# Patient Record
Sex: Male | Born: 1980 | Race: Black or African American | Hispanic: No | Marital: Married
Health system: Southern US, Community
[De-identification: ages and names within clinical notes are randomized; demographics above are authoritative.]

## PROBLEM LIST (undated history)

## (undated) DIAGNOSIS — G43909 Migraine, unspecified, not intractable, without status migrainosus: Secondary | ICD-10-CM

---

## 2018-03-20 ENCOUNTER — Emergency Department (HOSPITAL_COMMUNITY)
Admission: EM | Admit: 2018-03-20 | Discharge: 2018-03-20 | Disposition: A | Discharge status: Home / Self Care | Payer: BLUE CROSS/BLUE SHIELD | Attending: Emergency Medicine | Admitting: Emergency Medicine

## 2018-03-20 ENCOUNTER — Emergency Department (HOSPITAL_COMMUNITY): Payer: BLUE CROSS/BLUE SHIELD

## 2018-03-20 ENCOUNTER — Encounter (HOSPITAL_COMMUNITY): Payer: Self-pay | Admitting: Emergency Medicine

## 2018-03-20 DIAGNOSIS — Y999 Unspecified external cause status: Secondary | ICD-10-CM | POA: Insufficient documentation

## 2018-03-20 DIAGNOSIS — W540XXA Bitten by dog, initial encounter: Secondary | ICD-10-CM | POA: Diagnosis not present

## 2018-03-20 DIAGNOSIS — S61213A Laceration without foreign body of left middle finger without damage to nail, initial encounter: Secondary | ICD-10-CM | POA: Insufficient documentation

## 2018-03-20 DIAGNOSIS — S61411A Laceration without foreign body of right hand, initial encounter: Secondary | ICD-10-CM | POA: Diagnosis not present

## 2018-03-20 DIAGNOSIS — Y9389 Activity, other specified: Secondary | ICD-10-CM | POA: Insufficient documentation

## 2018-03-20 DIAGNOSIS — Y929 Unspecified place or not applicable: Secondary | ICD-10-CM | POA: Insufficient documentation

## 2018-03-20 DIAGNOSIS — S61512A Laceration without foreign body of left wrist, initial encounter: Secondary | ICD-10-CM

## 2018-03-20 DIAGNOSIS — S61511A Laceration without foreign body of right wrist, initial encounter: Secondary | ICD-10-CM | POA: Diagnosis not present

## 2018-03-20 HISTORY — DX: Migraine, unspecified, not intractable, without status migrainosus: G43.909

## 2018-03-20 MED ORDER — OXYCODONE-ACETAMINOPHEN 5-325 MG PO TABS
1.0000 | ORAL_TABLET | Freq: Once | ORAL | Status: AC
  Administered 2018-03-20: 1 via ORAL
  Filled 2018-03-20: qty 1

## 2018-03-20 MED ORDER — AMOXICILLIN-POT CLAVULANATE 875-125 MG PO TABS
1.0000 | ORAL_TABLET | Freq: Once | ORAL | Status: AC
  Administered 2018-03-20: 1 via ORAL
  Filled 2018-03-20: qty 1

## 2018-03-20 MED ORDER — AMOXICILLIN-POT CLAVULANATE 875-125 MG PO TABS: 1.0000 | ORAL_TABLET | Freq: Two times a day (BID) | ORAL | 0 refills | Status: AC

## 2018-03-20 MED ORDER — OXYCODONE-ACETAMINOPHEN 5-325 MG PO TABS: 1.0000 | ORAL_TABLET | Freq: Four times a day (QID) | ORAL | 0 refills | Status: AC | PRN

## 2018-03-20 MED ORDER — LIDOCAINE HCL 2 % IJ SOLN
20.0000 mL | Freq: Once | INTRAMUSCULAR | Status: AC
  Administered 2018-03-20: 400 mg
  Filled 2018-03-20: qty 20

## 2018-03-20 NOTE — ED Triage Notes (Signed)
Pt was breaking up a dog fight and obtained lacerations to the right forearm and right finger. Pt has slight numbness to right thumb. Pt was diaphoretic at scene. Pt states his dogs are up to date on their vaccines. Unsure of last tetanus.

## 2018-03-20 NOTE — ED Provider Notes (Signed)
Patient placed in Quick Look pathway, seen and evaluated   Chief Complaint: dog bite  HPI:   Patient tried to break up a fight b/n his dogs. Suffered deep bite wounds to the R forearm, hand and left hand. R hand cdominant. utd on tetanus, dogs are utd on shots.   ROS: wound (one)  Physical Exam:   Gen: No distress  Neuro: Awake and Alert  Skin: Warm    Focused Exam: R radial pulse normal, Mild R thumb numbness.   Initiation of care has begun. The patient has been counseled on the process, plan, and necessity for staying for the completion/evaluation, and the remainder of the medical screening examination    Arthor CaptainHarris, Pierre Dellarocco, Cordelia Poche-C 03/20/18 1813    Linwood DibblesKnapp, Jon, MD 03/23/18 2020

## 2018-03-20 NOTE — Discharge Instructions (Addendum)
Please read attached information. If you experience any new or worsening signs or symptoms please return to the emergency room for evaluation. Please follow-up with your primary care provider or specialist as discussed. Please use medication prescribed only as directed and discontinue taking if you have any concerning signs or symptoms.   °

## 2018-03-20 NOTE — ED Provider Notes (Signed)
[facility] Provider Note   CSN: [id number] Arrival date & time: 03/20/18  1804  History   Chief Complaint Chief Complaint  Patient presents with  . Animal Bite    HPI Murlean IbaMichael Ulbricht is a 37 y.o. male.  HPI   37 year old male presents today status post animal bite.  Patient was breaking up a fight with his dogs when he got in the way.  He notes lacerations to his right wrist and hand, left finger.  She notes his tetanus is up-to-date.  He notes full active range of motion of the fingers and hand, decreased range of motion of the wrist secondary to pain.  He notes pain over the dorsal wrist and hand with associated swelling.     Past Medical History:  Diagnosis Date  . Migraine     There are no active problems to display for this patient.   Histories were reviewed      Home Medications    Prior to Admission medications   Medication Sig Start Date End Date Taking? Authorizing Provider  amoxicillin-clavulanate (AUGMENTIN) 875-125 MG tablet Take 1 tablet by mouth every 12 (twelve) hours. 03/20/18   Porshea Janowski, Tinnie GensJeffrey, PA-C  oxyCODONE-acetaminophen (PERCOCET/ROXICET) 5-325 MG tablet Take 1 tablet by mouth every 6 (six) hours as needed for severe pain. 03/20/18   Eyvonne MechanicHedges, Itzel Mckibbin, PA-C    Family History No family history on file.  Social History Social History   Tobacco Use  . Smoking status: Not on file  Substance Use Topics  . Alcohol use: Not on file  . Drug use: Not on file     Allergies   Patient has no known allergies.   Review of Systems Review of Systems  All other systems reviewed and are negative.    Physical Exam Updated Vital Signs BP 136/70 (BP Location: Left Arm)   Pulse 81   Temp 97.8 F (36.6 C)   Resp 16   SpO2 98%   Physical Exam  Musculoskeletal:  Lacerations noted to the right wrist and hand noted in the photos-from distal to proximal 1 cm, 1 cm, 3 cm; wrist with 3 cm laceration, no tendon or  bone involvement-tenderness palpation of the proximal hand diffusely, nonfocal distal sensation intact  Left middle finger with 1 cm laceration, right middle finger 1 cm laceration          ED Treatments / Results  Labs (all labs ordered are listed, but only abnormal results are displayed) Labs Reviewed - No data to display  EKG None  Radiology No results found.  Procedures Procedures (including critical care time)  Medications Ordered in ED Medications  lidocaine (XYLOCAINE) 2 % (with pres) injection 400 mg (400 mg Infiltration Given by Other 03/20/18 2242)  oxyCODONE-acetaminophen (PERCOCET/ROXICET) 5-325 MG per tablet 1 tablet (1 tablet Oral Given 03/20/18 1948)  amoxicillin-clavulanate (AUGMENTIN) 875-125 MG per tablet 1 tablet (1 tablet Oral Given 03/20/18 2017)  oxyCODONE-acetaminophen (PERCOCET/ROXICET) 5-325 MG per tablet 1 tablet (1 tablet Oral Given 03/20/18 2301)   LACERATION REPAIR Performed by: Kelle DartingJeffrey Todd Tabithia Stroder Authorized by: Kelle DartingJeffrey Todd Lezlie Ritchey Consent: Verbal consent obtained. Risks and benefits: risks, benefits and alternatives were discussed Consent given by: patient Patient identity confirmed: provided demographic data Prepped and Draped in normal sterile fashion Wound explored  Laceration Location: Right wrist, right hand, left finger   Laceration Length: Right wrist 1 cm, 1 cm, 3 cm, 3 cm   right middle finger : 1 cm .  Left middle finger  1 cm   No Foreign Bodies seen or palpated  Anesthesia: local infiltration  Local anesthetic: lidocaine 2 % without epinephrine  Anesthetic total: 8 ml  Irrigation method: syringe Amount of cleaning: standard  Skin closure: Simple  Number of sutures: 1, 1, 3, 4, 1, 1 to the above lacerations respectively-total sutures 11  Technique: Simple interrupted  Patient tolerance: Patient tolerated the procedure well with no immediate complications.   Initial Impression / Assessment and Plan / ED Course  I  have reviewed the triage vital signs and the nursing notes.  Pertinent labs & imaging results that were available during my care of the patient were reviewed by me and considered in my medical decision making (see chart for details).     37 year old male presents today with animal bite.  He was given dose of Augmentin here, wounds copiously irrigated.  Due to the nature of the wounds they were loosely approximated.  Patient has noted major tendon bony vascular neuro involvement.  Patient does have pain to the wrist including tenderness over the snuffbox.  I had a discussion with the patient and family at bedside indicating the need for follow-up imaging of the hand in 7 to 14 days to assure no scaphoid fracture.  Patient understands this and verbalized that he would follow-up for this.  He had dissolvable stitches placed, he will take antibiotics return if any signs of infection present.  Patient had no further questions or concerns the time discharge.  Final Clinical Impressions(s) / ED Diagnoses   Final diagnoses:  Dog bite, initial encounter  Laceration of skin of left wrist, initial encounter    ED Discharge Orders        Ordered    amoxicillin-clavulanate (AUGMENTIN) 875-125 MG tablet  Every 12 hours     03/20/18 2253    oxyCODONE-acetaminophen (PERCOCET/ROXICET) 5-325 MG tablet  Every 6 hours PRN     03/20/18 2255       Eyvonne Mechanic, PA-C 03/24/18 1340    Linwood Dibbles, MD 03/25/18 [phone]

## 2018-03-20 NOTE — ED Notes (Signed)
Ortho tech paged  

## 2018-03-20 NOTE — ED Notes (Signed)
LIDOCAINE AND SUTURE CART AT BEDSIDE

## 2019-02-11 IMAGING — DX DG HAND COMPLETE 3+V*L*
3 series · 3 of 3 positions shown · non-contrast
Comparison: None.

CLINICAL DATA: Pt was breaking up a dog fight and obtained
lacerations to the right forearm and right finger. Pt has slight
numbness to right thumb. Pt was diaphoretic at scene. Pt states his
dogs are up to date on their vaccines.

EXAM:
LEFT HAND - COMPLETE 3+ VIEW

[hand pa]
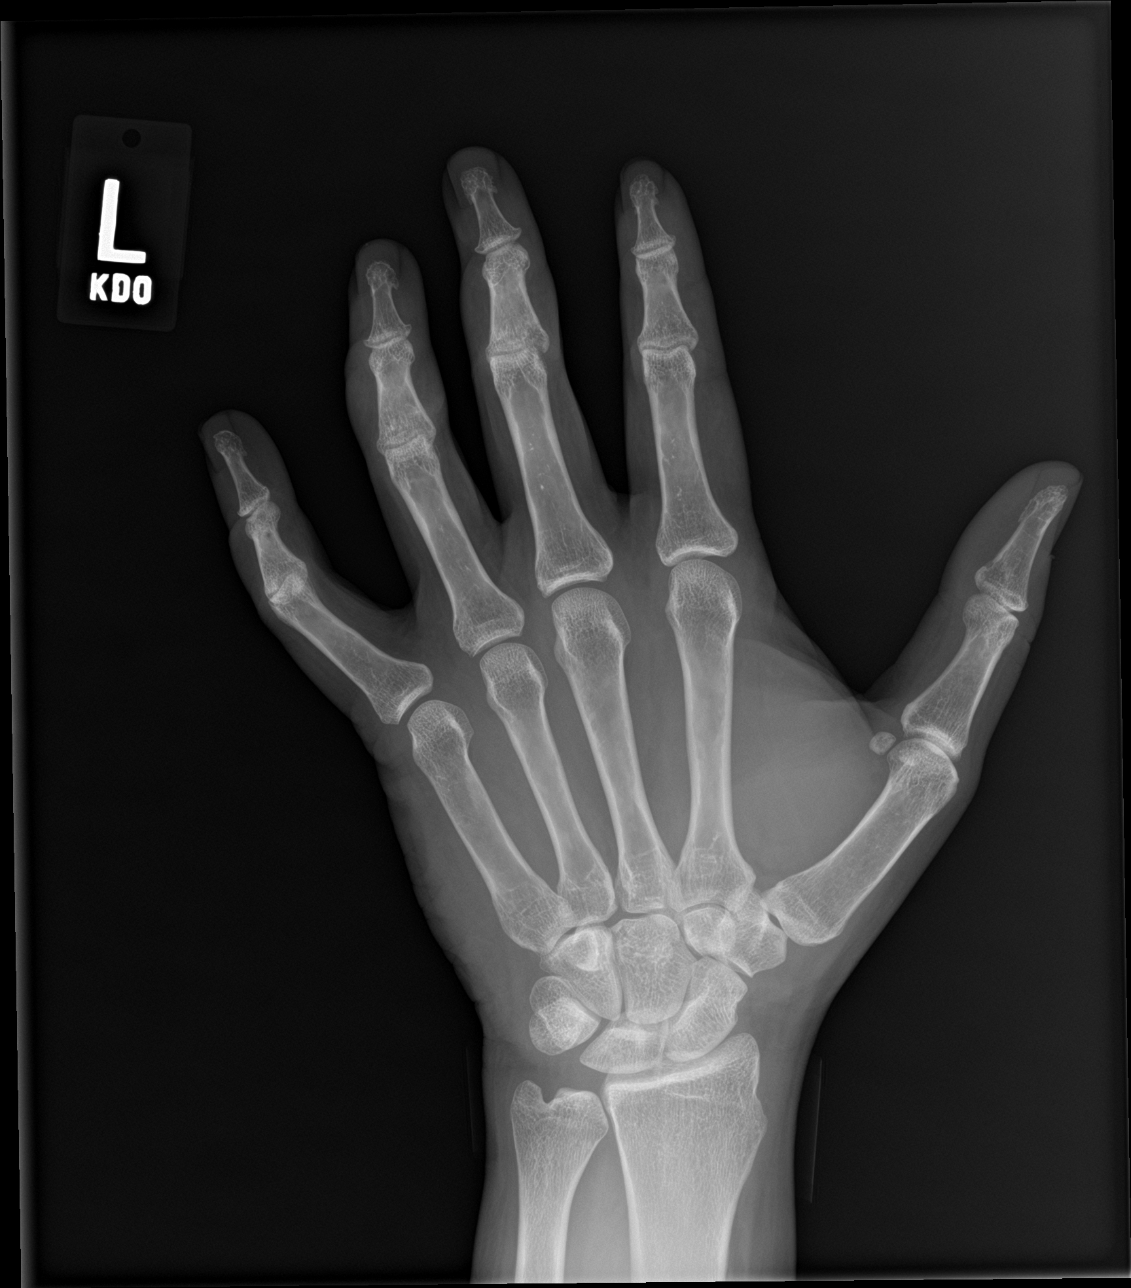

[hand obl]
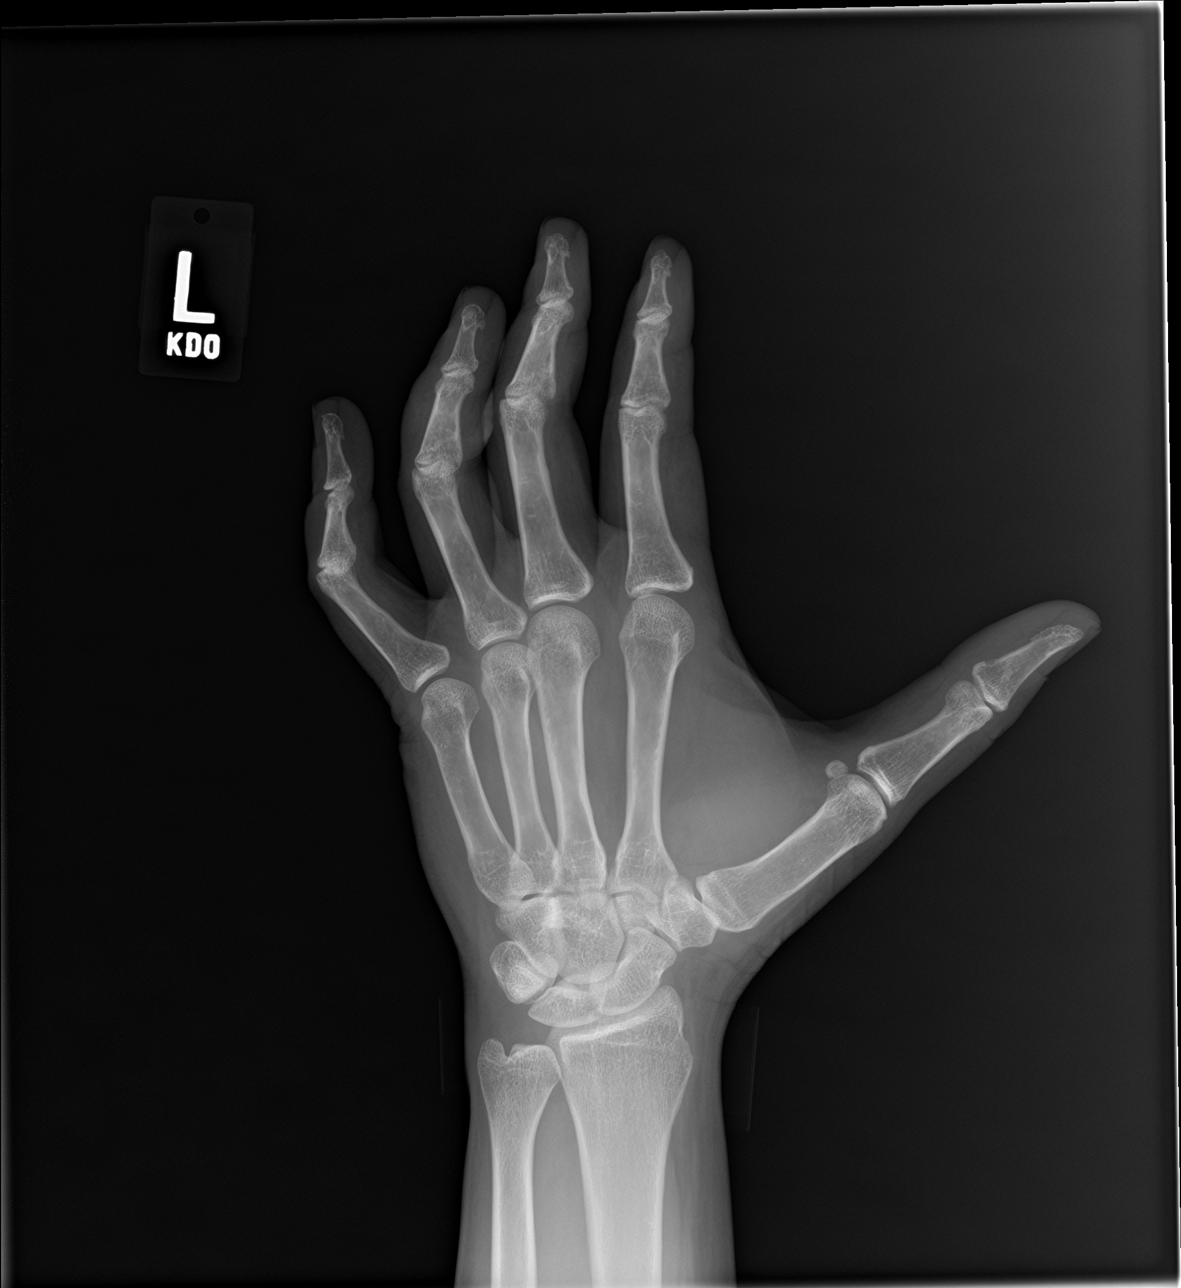

[hand lat]
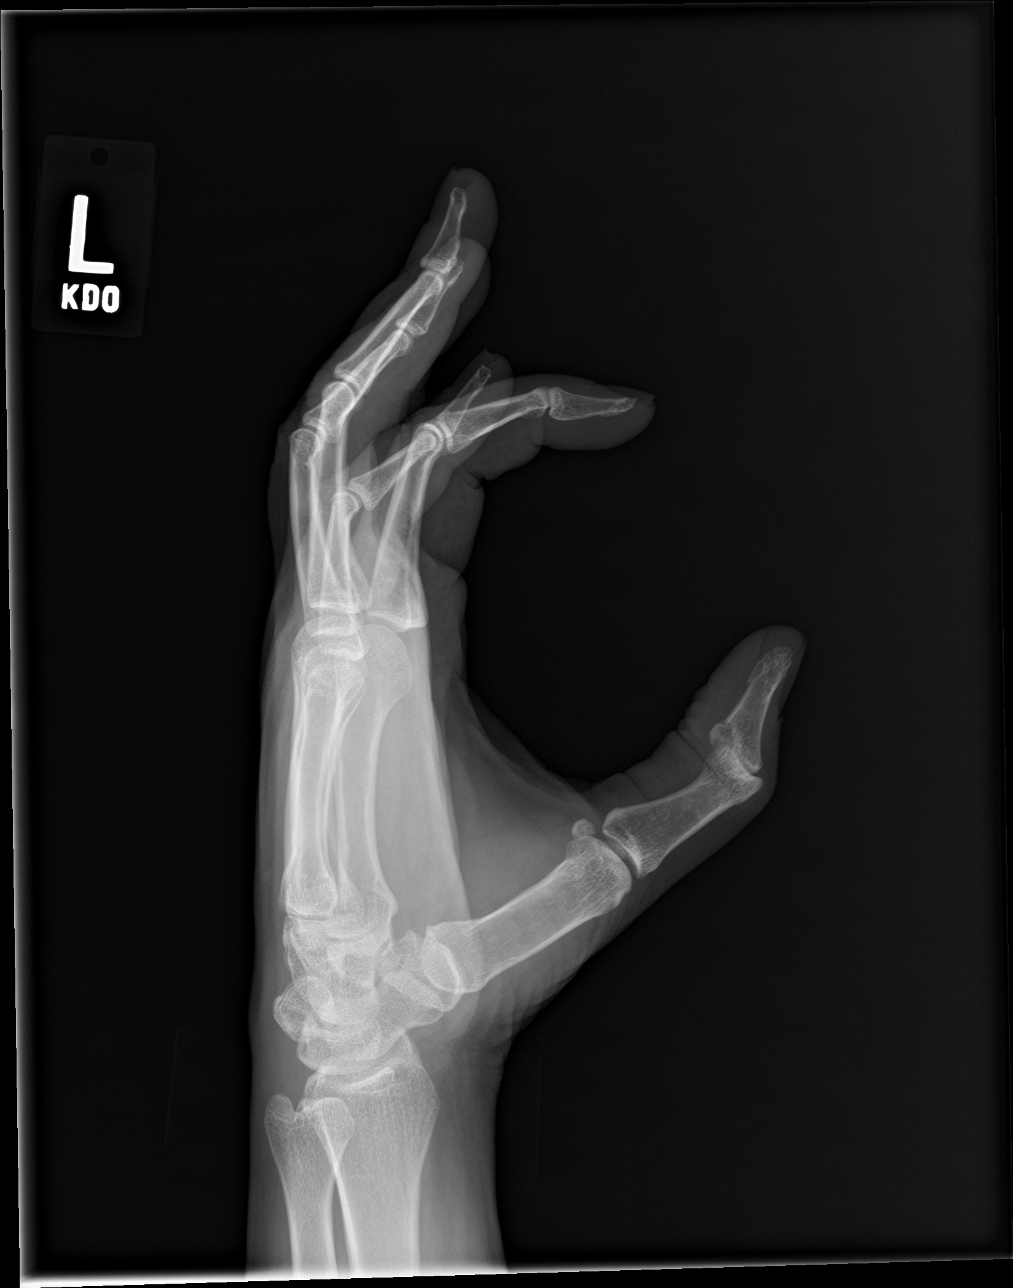

[3 of 3 positions shown; findings below may reference images not displayed]

FINDINGS: There is soft tissue swelling in the mid third, 4th, and 5th digits.
No radiopaque foreign body or soft tissue gas. No acute fractures.
IMPRESSION: Soft tissue swelling.  No acute fractures.
